# Patient Record
Sex: Male | Born: 1980 | Race: White | Hispanic: No | Marital: Married | Smoking: Current every day smoker
Health system: Southern US, Community
[De-identification: ages and names within clinical notes are randomized; demographics above are authoritative.]

---

## 2014-01-03 ENCOUNTER — Emergency Department (HOSPITAL_COMMUNITY): Payer: No Typology Code available for payment source

## 2014-01-03 ENCOUNTER — Encounter (HOSPITAL_COMMUNITY): Payer: Self-pay | Admitting: Emergency Medicine

## 2014-01-03 ENCOUNTER — Emergency Department (HOSPITAL_COMMUNITY)
Admission: EM | Admit: 2014-01-03 | Discharge: 2014-01-04 | Disposition: A | Discharge status: Home / Self Care | Payer: No Typology Code available for payment source | Attending: Emergency Medicine | Admitting: Emergency Medicine

## 2014-01-03 DIAGNOSIS — Y92410 Unspecified street and highway as the place of occurrence of the external cause: Secondary | ICD-10-CM | POA: Insufficient documentation

## 2014-01-03 DIAGNOSIS — T148XXA Other injury of unspecified body region, initial encounter: Secondary | ICD-10-CM

## 2014-01-03 DIAGNOSIS — Z23 Encounter for immunization: Secondary | ICD-10-CM | POA: Insufficient documentation

## 2014-01-03 DIAGNOSIS — F17200 Nicotine dependence, unspecified, uncomplicated: Secondary | ICD-10-CM | POA: Diagnosis not present

## 2014-01-03 DIAGNOSIS — S5010XA Contusion of unspecified forearm, initial encounter: Secondary | ICD-10-CM | POA: Insufficient documentation

## 2014-01-03 DIAGNOSIS — Y9389 Activity, other specified: Secondary | ICD-10-CM | POA: Diagnosis not present

## 2014-01-03 DIAGNOSIS — S0993XA Unspecified injury of face, initial encounter: Secondary | ICD-10-CM | POA: Diagnosis not present

## 2014-01-03 DIAGNOSIS — S59909A Unspecified injury of unspecified elbow, initial encounter: Secondary | ICD-10-CM | POA: Diagnosis present

## 2014-01-03 DIAGNOSIS — S199XXA Unspecified injury of neck, initial encounter: Secondary | ICD-10-CM

## 2014-01-03 MED ORDER — FENTANYL CITRATE 0.05 MG/ML IJ SOLN
INTRAMUSCULAR | Status: AC
  Filled 2014-01-03: qty 2

## 2014-01-03 MED ORDER — FENTANYL CITRATE 0.05 MG/ML IJ SOLN
50.0000 ug | Freq: Once | INTRAMUSCULAR | Status: AC
  Administered 2014-01-03: 50 ug via NASAL

## 2014-01-03 MED ORDER — OXYCODONE-ACETAMINOPHEN 5-325 MG PO TABS
1.0000 | ORAL_TABLET | Freq: Once | ORAL | Status: AC
  Administered 2014-01-03: 1 via ORAL
  Filled 2014-01-03: qty 1
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
## 2014-01-04 ENCOUNTER — Emergency Department (HOSPITAL_COMMUNITY): Payer: No Typology Code available for payment source

## 2014-01-04 DIAGNOSIS — S5010XA Contusion of unspecified forearm, initial encounter: Secondary | ICD-10-CM | POA: Diagnosis not present

## 2014-01-04 MED ORDER — DIAZEPAM 5 MG PO TABS
5.0000 mg | ORAL_TABLET | Freq: Once | ORAL | Status: AC
  Administered 2014-01-04: 5 mg via ORAL
  Filled 2014-01-04: qty 1

## 2014-01-04 MED ORDER — BACITRACIN-NEOMYCIN-POLYMYXIN 400-5-5000 EX OINT: 1.0000 "application " | TOPICAL_OINTMENT | Freq: Two times a day (BID) | CUTANEOUS | Status: DC

## 2014-01-04 MED ORDER — OXYCODONE-ACETAMINOPHEN 5-325 MG PO TABS
1.0000 | ORAL_TABLET | Freq: Once | ORAL | Status: AC
  Administered 2014-01-04: 1 via ORAL
  Filled 2014-01-04: qty 1

## 2014-01-04 MED ORDER — SODIUM CHLORIDE 0.9 % IV BOLUS (SEPSIS): 1000.0000 mL | Freq: Once | INTRAVENOUS | Status: DC

## 2014-01-04 MED ORDER — HYDROCODONE-ACETAMINOPHEN 5-325 MG PO TABS: 1.0000 | ORAL_TABLET | Freq: Four times a day (QID) | ORAL | Status: DC | PRN

## 2014-01-04 MED ORDER — IBUPROFEN 400 MG PO TABS
600.0000 mg | ORAL_TABLET | Freq: Once | ORAL | Status: AC
  Administered 2014-01-04: 600 mg via ORAL
  Filled 2014-01-04 (×2): qty 1

## 2014-01-04 MED ORDER — METHOCARBAMOL 500 MG PO TABS: 500.0000 mg | ORAL_TABLET | Freq: Two times a day (BID) | ORAL | Status: AC

## 2014-01-04 MED ORDER — IBUPROFEN 600 MG PO TABS: 600.0000 mg | ORAL_TABLET | Freq: Four times a day (QID) | ORAL | Status: DC | PRN

## 2014-01-04 MED ORDER — TETANUS-DIPHTH-ACELL PERTUSSIS 5-2.5-18.5 LF-MCG/0.5 IM SUSP
0.5000 mL | Freq: Once | INTRAMUSCULAR | Status: AC
  Administered 2014-01-04: 0.5 mL via INTRAMUSCULAR
  Filled 2014-01-04: qty 0.5
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
## 2014-01-04 NOTE — ED Notes (Signed)
Dr Rhunette Croftnanavati in room
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
## 2014-01-04 NOTE — ED Notes (Addendum)
Pt refused bandaging to arm. Refused to have vital signs rechecked. Pt ambulatory and in nad.

## 2014-01-05 ENCOUNTER — Emergency Department (HOSPITAL_COMMUNITY)
Admission: EM | Admit: 2014-01-05 | Discharge: 2014-01-05 | Disposition: A | Discharge status: Home / Self Care | Payer: No Typology Code available for payment source | Attending: Emergency Medicine | Admitting: Emergency Medicine

## 2014-01-05 ENCOUNTER — Emergency Department (HOSPITAL_COMMUNITY): Payer: No Typology Code available for payment source

## 2014-01-05 ENCOUNTER — Encounter (HOSPITAL_COMMUNITY): Payer: Self-pay | Admitting: Emergency Medicine

## 2014-01-05 DIAGNOSIS — M25551 Pain in right hip: Secondary | ICD-10-CM

## 2014-01-05 DIAGNOSIS — R0789 Other chest pain: Secondary | ICD-10-CM

## 2014-01-05 DIAGNOSIS — M25529 Pain in unspecified elbow: Secondary | ICD-10-CM | POA: Insufficient documentation

## 2014-01-05 DIAGNOSIS — M25559 Pain in unspecified hip: Secondary | ICD-10-CM | POA: Insufficient documentation

## 2014-01-05 DIAGNOSIS — M542 Cervicalgia: Secondary | ICD-10-CM | POA: Insufficient documentation

## 2014-01-05 DIAGNOSIS — Z79899 Other long term (current) drug therapy: Secondary | ICD-10-CM | POA: Insufficient documentation

## 2014-01-05 DIAGNOSIS — Z043 Encounter for examination and observation following other accident: Secondary | ICD-10-CM

## 2014-01-05 DIAGNOSIS — Z041 Encounter for examination and observation following transport accident: Secondary | ICD-10-CM

## 2014-01-05 DIAGNOSIS — G8911 Acute pain due to trauma: Secondary | ICD-10-CM | POA: Insufficient documentation

## 2014-01-05 DIAGNOSIS — F17200 Nicotine dependence, unspecified, uncomplicated: Secondary | ICD-10-CM | POA: Insufficient documentation

## 2014-01-05 DIAGNOSIS — R071 Chest pain on breathing: Secondary | ICD-10-CM | POA: Insufficient documentation

## 2014-01-05 DIAGNOSIS — M79601 Pain in right arm: Secondary | ICD-10-CM

## 2014-01-05 DIAGNOSIS — M25519 Pain in unspecified shoulder: Secondary | ICD-10-CM | POA: Insufficient documentation

## 2014-01-05 MED ORDER — DIAZEPAM 5 MG PO TABS: 5.0000 mg | ORAL_TABLET | Freq: Three times a day (TID) | ORAL | Status: AC | PRN

## 2014-01-05 MED ORDER — DIAZEPAM 5 MG/ML IJ SOLN
5.0000 mg | Freq: Once | INTRAMUSCULAR | Status: AC
  Administered 2014-01-05: 5 mg via INTRAMUSCULAR

## 2014-01-05 MED ORDER — DIAZEPAM 5 MG/ML IJ SOLN
5.0000 mg | Freq: Once | INTRAMUSCULAR | Status: DC
  Filled 2014-01-05: qty 2

## 2014-01-05 MED ORDER — HYDROCODONE-ACETAMINOPHEN 5-325 MG PO TABS: 1.0000 | ORAL_TABLET | ORAL | Status: AC | PRN
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
## 2014-01-05 NOTE — ED Provider Notes (Signed)
Medical screening examination/treatment/procedure(s) were conducted as a shared visit with non-physician practitioner(s) and myself.  I personally evaluated the patient during the encounter.   EKG Interpretation None     Here complaining of whole-body myalgias and musculoskeletal pain in his neck. No concern for carotid dissection. Stable for discharge  Toy BakerAnthony T Shaylea Ucci, MD 01/05/14 1355

## 2014-01-05 NOTE — ED Notes (Signed)
Bed: WA07 Expected date:  Expected time:  Means of arrival:  Comments: 

## 2014-01-05 NOTE — Discharge Instructions (Signed)
Read the information below.  Use the prescribed medication as directed.  Please discuss all new medications with your pharmacist.  Do not take additional tylenol while taking the prescribed pain medication to avoid overdose.  You may return to the Emergency Department at any time for worsening condition or any new symptoms that concern you.   If you develop fevers, loss of control of bowel or bladder, weakness or numbness in your arms or legs, or are unable to walk, return to the ER for a recheck.  °

## 2014-01-07 NOTE — ED Provider Notes (Signed)
Medical screening examination/treatment/procedure(s) were performed by non-physician practitioner and as supervising physician I was immediately available for consultation/collaboration.  Bob Eastwood T Marjorie Lussier, MD 01/07/14 2342 

## 2014-01-16 ENCOUNTER — Encounter (HOSPITAL_COMMUNITY): Payer: Self-pay | Admitting: Emergency Medicine

## 2014-01-16 ENCOUNTER — Emergency Department (HOSPITAL_COMMUNITY)
Admission: EM | Admit: 2014-01-16 | Discharge: 2014-01-16 | Disposition: A | Discharge status: Home / Self Care | Payer: No Typology Code available for payment source | Attending: Emergency Medicine | Admitting: Emergency Medicine

## 2014-01-16 DIAGNOSIS — R269 Unspecified abnormalities of gait and mobility: Secondary | ICD-10-CM | POA: Insufficient documentation

## 2014-01-16 DIAGNOSIS — M25539 Pain in unspecified wrist: Secondary | ICD-10-CM | POA: Insufficient documentation

## 2014-01-16 DIAGNOSIS — G8911 Acute pain due to trauma: Secondary | ICD-10-CM | POA: Insufficient documentation

## 2014-01-16 DIAGNOSIS — Z79899 Other long term (current) drug therapy: Secondary | ICD-10-CM | POA: Insufficient documentation

## 2014-01-16 DIAGNOSIS — M542 Cervicalgia: Secondary | ICD-10-CM | POA: Insufficient documentation

## 2014-01-16 DIAGNOSIS — M25532 Pain in left wrist: Secondary | ICD-10-CM

## 2014-01-16 DIAGNOSIS — Z0000 Encounter for general adult medical examination without abnormal findings: Secondary | ICD-10-CM

## 2014-01-16 DIAGNOSIS — F17200 Nicotine dependence, unspecified, uncomplicated: Secondary | ICD-10-CM | POA: Insufficient documentation

## 2014-01-16 DIAGNOSIS — R071 Chest pain on breathing: Secondary | ICD-10-CM | POA: Insufficient documentation

## 2014-01-16 DIAGNOSIS — M25549 Pain in joints of unspecified hand: Secondary | ICD-10-CM | POA: Insufficient documentation
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
## 2014-01-16 NOTE — ED Notes (Addendum)
Discussed discharge paperwork with patient and provided information regarding referral to orthopaedic provider. Patient voiced that he was unhappy with his pain management. I offered to inquire about pain medications with the EDPA, however patient stated that "I'm just leaving now, they aren't going to do anything for me." Pt stated that he was going to go to [facility] after hours [facility]. Pt left the department ambulatory without difficulty.
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
## 2014-01-16 NOTE — Discharge Instructions (Signed)
For pain control please take ibuprofen (also known as Motrin or Advil) 800mg  (this is normally 4 over the counter pills) 3 times a day  for 5 days. Take with food to minimize stomach irritation.  Please follow with your primary care doctor in the next 2 days for a check-up. They must obtain records for further management.   Do not hesitate to return to the Emergency Department for any new, worsening or concerning symptoms.    Normal Exam, Adult You were seen and examined today in our facility. Our caregiver found nothing wrong on the exam. If testing was done such as lab work or x-rays, they did not indicate enough wrong to suggest that treatment should be given. The caregiver then must decide after testing is finished if your concern is a physical problem or illness that needs treatment. Today no treatable problem was found. Even if reassurance was given, if you feel you are getting worse when you get home make sure you call back or return to our department. For the protection of your privacy, test results can not be given over the phone. Make sure you receive the results of your test. Ask as to how these results are to be obtained if you have not been informed. It is your responsibility to obtain your test results. Your condition can change over time. Sometimes it takes more than one visit to determine the cause of your symptoms. It is important that you monitor your condition for any changes. SEEK IMMEDIATE MEDICAL CARE IF:  You develop an oral temperature above 102 F (38.9 C), which lasts for more than 2 days, not controlled by medications. Only take over-the-counter or prescription medicines for pain, discomfort, or fever as directed by your caregiver.  You develop a loss of appetite or start throwing up (vomiting).  You develop a rash, cough, belly (abdominal) pain, earache, headache, or develop pain in neck, muscles, or joints.  The problem or problems which brought you to our facility  become worse or are a cause of more concern. If we have told you today your exam and tests are normal, and a short while later you feel this is not right, please seek medical attention so you may be rechecked. Document Released: 11/27/2000 Document Revised: 11/07/2011 Document Reviewed: 03/21/2008 [facility] Patient Information 2014 [employer] [place].

## 2014-01-17 NOTE — ED Provider Notes (Signed)
History/physical exam/procedure(s) were performed by non-physician practitioner and as supervising physician I was immediately available for consultation/collaboration. I have reviewed all notes and am in agreement with care and plan.   Kenly Henckel S Ronny Ruddell, MD 01/17/14 0807 
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
